# Patient Record
Sex: Male | Born: 1960 | Race: White | Hispanic: No | Marital: Married | Smoking: Never smoker
Health system: Southern US, Community
[De-identification: ages and names within clinical notes are randomized; demographics above are authoritative.]

## PROBLEM LIST (undated history)

## (undated) DIAGNOSIS — I10 Essential (primary) hypertension: Secondary | ICD-10-CM

## (undated) DIAGNOSIS — R140 Abdominal distension (gaseous): Secondary | ICD-10-CM

## (undated) HISTORY — DX: Essential (primary) hypertension: I10

## (undated) HISTORY — DX: Abdominal distension (gaseous): R14.0

---

## 2005-09-11 ENCOUNTER — Observation Stay (HOSPITAL_COMMUNITY): Admission: EM | Admit: 2005-09-11 | Discharge: 2005-09-12 | Payer: Self-pay | Admitting: Emergency Medicine

## 2011-09-04 ENCOUNTER — Ambulatory Visit (INDEPENDENT_AMBULATORY_CARE_PROVIDER_SITE_OTHER): Payer: BC Managed Care – PPO | Admitting: Surgery

## 2011-09-04 ENCOUNTER — Encounter (INDEPENDENT_AMBULATORY_CARE_PROVIDER_SITE_OTHER): Payer: Self-pay | Admitting: Surgery

## 2011-09-04 VITALS — BP 128/82 | HR 72 | Temp 98.1°F | Resp 18 | Ht 71.0 in | Wt 223.4 lb

## 2011-09-04 DIAGNOSIS — K4020 Bilateral inguinal hernia, without obstruction or gangrene, not specified as recurrent: Secondary | ICD-10-CM | POA: Insufficient documentation

## 2011-09-04 DIAGNOSIS — K4090 Unilateral inguinal hernia, without obstruction or gangrene, not specified as recurrent: Secondary | ICD-10-CM

## 2011-09-04 NOTE — Progress Notes (Signed)
Subjective:     Patient ID: David Barnett, male   DOB: [birth year], 50 y.o.   MRN: [id number]  HPI  Patient Care Team: Dibas Koirala as PCP - General (Family Medicine)  This patient is a 50 y.o.male who presents today for surgical evaluation.   Reason for visit: Right groin swelling with probable hernia  Patient is a healthy active male. He noticed right groin pain after some heavy lifting in August. It calmed down. He saw his primary care physician. There was no definite hernia. After an episode of sneezing a few weeks ago, he noted a definite bulge. He has some discomfort. His primary care physician examine and agreed.  The patient has no problems on the left side. No problems with urination. Daily bowel movements. He had a negative cardiac workup in 2006 for atypical chest pain. He is otherwise very active. No prior abdominal nor hernia surgeries  Past Medical History  Diagnosis Date  . Abdominal pain   . Abdominal distention   . Hypertension     History reviewed. No pertinent past surgical history.  History   Social History  . Marital Status: Married    Spouse Name: N/A    Number of Children: N/A  . Years of Education: N/A   Occupational History  . Not on file.   Social History Main Topics  . Smoking status: Never Smoker   . Smokeless tobacco: Never Used  . Alcohol Use: 2.4 oz/week    2 Glasses of wine, 2 Cans of beer per week  . Drug Use: No  . Sexually Active:    Other Topics Concern  . Not on file   Social History Narrative  . No narrative on file    Family History  Problem Relation Age of Onset  . Hearing loss Father 41    heart failure     Current outpatient prescriptions:aspirin 81 MG tablet, Take 81 mg by mouth daily.  , Disp: , Rfl: ;  lisinopril (PRINIVIL,ZESTRIL) 20 MG tablet, , Disp: , Rfl:   No Known Allergies     Review of Systems  Constitutional: Negative for fever, chills and diaphoresis.  HENT: Negative for nosebleeds, sore throat,  facial swelling, mouth sores, trouble swallowing and ear discharge.   Eyes: Negative for photophobia, discharge and visual disturbance.  Respiratory: Negative for choking, chest tightness, shortness of breath and stridor.   Cardiovascular: Negative for chest pain and palpitations.  Gastrointestinal: Negative for nausea, vomiting, abdominal pain, diarrhea, constipation, blood in stool, abdominal distention, anal bleeding and rectal pain.  Genitourinary: Negative for dysuria, urgency, frequency, discharge, penile swelling, difficulty urinating, penile pain and testicular pain.  Musculoskeletal: Negative for myalgias, back pain, arthralgias and gait problem.  Skin: Negative for color change, pallor, rash and wound.  Neurological: Negative for dizziness, speech difficulty, weakness, numbness and headaches.  Hematological: Negative for adenopathy. Does not bruise/bleed easily.  Psychiatric/Behavioral: Negative for hallucinations, confusion and agitation.       Objective:   Physical Exam  Constitutional: He is oriented to person, place, and time. He appears well-developed and well-nourished. No distress.  HENT:  Head: Normocephalic.  Mouth/Throat: Oropharynx is clear and moist. No oropharyngeal exudate.  Eyes: Conjunctivae and EOM are normal. Pupils are equal, round, and reactive to light. No scleral icterus.  Neck: Normal range of motion. Neck supple. No tracheal deviation present.  Cardiovascular: Normal rate, regular rhythm and intact distal pulses.   Pulmonary/Chest: Effort normal and breath sounds normal. No respiratory distress.  Abdominal: Soft.  He exhibits no distension and no mass. There is no tenderness. There is no guarding. Hernia confirmed negative in the left inguinal area.  Genitourinary: Penis normal.     Musculoskeletal: Normal range of motion. He exhibits no tenderness.  Lymphadenopathy:    He has no cervical adenopathy.       Right: No inguinal adenopathy present.        Left: No inguinal adenopathy present.  Neurological: He is alert and oriented to person, place, and time. No cranial nerve deficit. He exhibits normal muscle tone. Coordination normal.  Skin: Skin is warm and dry. No rash noted. He is not diaphoretic. No erythema. No pallor.  Psychiatric: He has a normal mood and affect. His behavior is normal. Judgment and thought content normal.       Assessment:     RIH     Plan:     The anatomy & physiology of the abdominal wall was discussed.  The pathophysiology of hernias was discussed.  Natural history risks without surgery of enlargement, pain, incarceration & strangulation was discussed.   Contributors to complications such as smoking, obesity, diabetes, prior surgery, etc were discussed.  I feel the risks of no intervention will lead to serious problems that outweigh the operative risks; therefore, I recommended surgery to reduce and repair the hernia.  I explained laparoscopic techniques with possible need for an open approach.  I noted the probable use of mesh to patch and/or buttress hernia repair  Risks such as bleeding, infection, abscess, need for further treatment, heart attack, death, and other risks were discussed.  Goals of post-operative recovery were discussed as well.  Possibility that this will not correct all symptoms was explained.  I stressed the importance of low-impact activity, aggressive pain control, avoiding constipation, & not pushing through pain to minimize risk of post-operative chronic pain or injury. Possibility of reherniation was discussed.  We will work to minimize complications.   An educational handout further explaining the pathology & treatment options was given as well.  Questions were answered.  The patient expresses understanding & wishes to proceed with surgery.

## 2011-09-04 NOTE — Patient Instructions (Signed)
Hernia  SEE THE LAPAROSCOPIC HERNIA HANDOUT   A hernia occurs when an internal organ pushes out through a weak spot in the belly (abdominal) wall. Hernias most commonly occur in the groin and around the navel. Hernias also can occur through by cut (incision) made by the surgeon after an abdominal operation. Hernias often can be pushed back into place (reduced). Most hernias tend to get worse over time. Problems occur when abdominal contents get stuck in the opening (incarcerated hernia). The blood supply becomes blocked or impaired (strangulated hernia). Because of these risks, you may require surgery to repair the hernia. CAUSES  Heavy lifting.   Prolonged coughing.   Straining to move your bowels.   Hernias can also occur through a cut (incision) by a surgeon after an abdominal operation.  HOME CARE INSTRUCTIONS  Bed rest is not required. You may continue your normal activities. Avoid heavy lifting (more than 10 pounds) or straining. Cough gently. If you are a smoker it is best to stop. Even the best hernia repair can break down with the continual strain of coughing. Even if you do not have your hernia repaired, a cough will continue to aggravate the problem.   Do not wear anything tight over your hernia. Do not try to keep it in with an outside bandage or truss. These can damage abdominal contents if they are trapped within the hernia sac.   Eat a normal diet. Avoid constipation. Straining over long periods of time will increase hernia size and encourage breakdown of repairs. If you cannot do this with diet alone, stool softeners may be used.  SEEK IMMEDIATE MEDICAL CARE IF: You have problems (symptoms) of a trapped (incarcerated) hernia:  You develop an oral temperature above 102F, or as your caregiver suggests.   You develop increasing abdominal pain.   You feel sick to your stomach (nausea) and vomiting.   The hernia is stuck outside the abdomen, looks discolored, feels hard, or  is tender.   You have any changes in your bowel habits or in the hernia that is unusual for you.   You have increased pain or swelling around the hernia.   You cannot push the hernia back in place by applying gentle pressure while lying down.  MAKE SURE YOU:   Understand these instructions.   Will watch your condition.   Will get help right away if you are not doing well or get worse.  Document Released: 11/18/2005 Document Re-Released: 09/15/2009 [facility] Patient Information 2011 [place], [place].

## 2011-09-17 DIAGNOSIS — K4020 Bilateral inguinal hernia, without obstruction or gangrene, not specified as recurrent: Secondary | ICD-10-CM

## 2011-09-17 HISTORY — PX: HERNIA REPAIR: SHX51

## 2011-09-25 ENCOUNTER — Telehealth (INDEPENDENT_AMBULATORY_CARE_PROVIDER_SITE_OTHER): Payer: Self-pay | Admitting: General Surgery

## 2011-09-25 ENCOUNTER — Telehealth (INDEPENDENT_AMBULATORY_CARE_PROVIDER_SITE_OTHER): Payer: Self-pay

## 2011-09-25 ENCOUNTER — Encounter (INDEPENDENT_AMBULATORY_CARE_PROVIDER_SITE_OTHER): Payer: Self-pay

## 2011-09-25 NOTE — Telephone Encounter (Signed)
Called pt to notify him that Dr Michaell Cowing advised ok to return to work. Pt requested a note to return to work. I will complete note and put at the front desk for pt to p/u 09-26-11./ [facility]

## 2011-09-25 NOTE — Telephone Encounter (Signed)
OK to RTW 29 October Monday

## 2011-09-25 NOTE — Telephone Encounter (Signed)
Mr David Barnett contacted the office requesting that he be able to RTW 09/26/11. His surgery was 09/17/11 for a lap RIH. I wanted to get your authorization for him to RTW

## 2011-10-01 ENCOUNTER — Encounter (INDEPENDENT_AMBULATORY_CARE_PROVIDER_SITE_OTHER): Payer: Self-pay | Admitting: Surgery

## 2011-10-01 ENCOUNTER — Ambulatory Visit (INDEPENDENT_AMBULATORY_CARE_PROVIDER_SITE_OTHER): Payer: BC Managed Care – PPO | Admitting: Surgery

## 2011-10-01 VITALS — BP 128/88 | HR 64 | Temp 97.7°F | Resp 20 | Ht 71.0 in | Wt 216.4 lb

## 2011-10-01 DIAGNOSIS — K458 Other specified abdominal hernia without obstruction or gangrene: Secondary | ICD-10-CM

## 2011-10-01 DIAGNOSIS — K4020 Bilateral inguinal hernia, without obstruction or gangrene, not specified as recurrent: Secondary | ICD-10-CM

## 2011-10-01 DIAGNOSIS — IMO0002 Reserved for concepts with insufficient information to code with codable children: Secondary | ICD-10-CM

## 2011-10-01 NOTE — Patient Instructions (Signed)
Hematoma °A hematoma is a collection of blood which is located any place outside a blood vessel. It is caused by an injury to blood vessels beneath an injured area with a leaking of blood into that area. As blood accumulates it is known as a hematoma. This collection of blood causes a blue to dark blue coloration of the tissue surrounding it. The injury usually improves in days to weeks. If it is close to the skin it often produces a yellowish color of the skin and then disappears completely over a brief period of time. These generally get better without any problems. Hematomas rarely need drainage. °HOME CARE INSTRUCTIONS  °· Apply ice to the injured area for 15 to 20 minutes 3 to 4 times per day for the first 1 or 2 days.  °· Put the ice in a plastic bag and place a towel between the bag of ice and your skin. Discontinue the ice if it causes pain.  °· If you have a hematoma and a nearby injury to the skin, you may have some bleeding. If the bleeding is more than just a little bit, apply pressure to the area for at least thirty minutes.  °· If the injury is on an extremity (arm, leg, hand or foot), elevation of that part may help to decrease pain and swelling. Wrapping with an ace or supportive wrap may also be helpful. If the hematoma is on a lower extremity and is painful, crutches may be helpful for a couple days.  °· If you have been given a tetanus shot because the skin was broken, your arm may get swollen, red and warm to touch at the shot site. This is a normal response to the medicine in the shot. If you did not receive a tetanus shot today because you did not recall when your last one was given, make sure to check with your caregiver's office and determine if one is needed. Generally for a "dirty" wound, you should receive a tetanus booster if you have not had one in the last five years. If you have a "clean" wound, you should receive a tetanus booster if you have not had one within the last ten years.   °· Only take over-the-counter or prescription medicines for pain, discomfort, or fever as directed by your caregiver. Do not use aspirin as it may cause bleeding.  °SEEK IMMEDIATE MEDICAL CARE IF:  °· You have pain not controlled with medication.  °· You develop increasing pain or swelling in the area of injury.  °· An oral temperature above 102° F (38.9° C) develops.  °· You develop any problems that seem worse than the problems that brought you in.  °MAKE SURE YOU:  °· Understand these instructions.  °· Will watch your condition.  °· Will get help right away if you are not doing well or get worse.  °Document Released: 07/02/2004 Document Revised: 06/13/2011 Document Reviewed: 07/19/2008 °[employer]® Patient Information ©2012 [employer]. °

## 2011-10-01 NOTE — Progress Notes (Signed)
Subjective:     Patient ID: David Barnett, male   DOB: [birth year], 50 y.o.   MRN: [id number]  HPI  Patient Care Team: Dibas Koirala as PCP - General (Family Medicine)  This patient is a 50 y.o.male who presents today for surgical evaluation.   Procedure: Laparoscopic bilateral inguinal and right obturator hernia repairs 09/17/2011  The patient comes in today feeling better. Had some moderate bruising in his scrotum. That is nearly resolved. Mild groin soreness nearly resolved. Eating well. Urinating fine. Regular problems. He is already back to work  Past Medical History  Diagnosis Date  . Abdominal pain   . Abdominal distention   . Hypertension     Past Surgical History  Procedure Date  . Hernia repair 09/17/11    Lap RIH repair with mesh     History   Social History  . Marital Status: Married    Spouse Name: N/A    Number of Children: N/A  . Years of Education: N/A   Occupational History  . Not on file.   Social History Main Topics  . Smoking status: Never Smoker   . Smokeless tobacco: Never Used  . Alcohol Use: 2.4 oz/week    2 Glasses of wine, 2 Cans of beer per week  . Drug Use: No  . Sexually Active:    Other Topics Concern  . Not on file   Social History Narrative  . No narrative on file    Family History  Problem Relation Age of Onset  . Hearing loss Father 30    heart failure     Current outpatient prescriptions:aspirin 81 MG tablet, Take 81 mg by mouth daily.  , Disp: , Rfl: ;  lisinopril (PRINIVIL,ZESTRIL) 20 MG tablet, , Disp: , Rfl:   No Known Allergies    Review of Systems  Constitutional: Negative for fever, chills and diaphoresis.  HENT: Negative for sore throat, trouble swallowing and neck pain.   Eyes: Negative for photophobia and visual disturbance.  Respiratory: Negative for choking and shortness of breath.   Cardiovascular: Negative for chest pain and palpitations.  Gastrointestinal: Negative for nausea, vomiting, abdominal  distention, anal bleeding and rectal pain.  Genitourinary: Negative for dysuria, urgency, penile swelling, scrotal swelling, difficulty urinating, penile pain and testicular pain.       Scrotal bruising, nearly gone  Musculoskeletal: Negative for myalgias, arthralgias and gait problem.  Skin: Negative for color change and rash.  Neurological: Negative for dizziness, speech difficulty, weakness and numbness.  Hematological: Negative for adenopathy.  Psychiatric/Behavioral: Negative for hallucinations, confusion and agitation.       Objective:   Physical Exam  Constitutional: He is oriented to person, place, and time. He appears well-developed and well-nourished. No distress.  HENT:  Head: Normocephalic.  Mouth/Throat: Oropharynx is clear and moist. No oropharyngeal exudate.  Eyes: Conjunctivae and EOM are normal. Pupils are equal, round, and reactive to light. No scleral icterus.  Neck: Normal range of motion. No tracheal deviation present.  Cardiovascular: Normal rate, normal heart sounds and intact distal pulses.   Pulmonary/Chest: Effort normal. No respiratory distress.  Abdominal: Soft. He exhibits no distension. There is no tenderness. Hernia confirmed negative in the right inguinal area and confirmed negative in the left inguinal area.       Incisions clean with normal healing ridges.  No hernias  Genitourinary: Penis normal. No penile tenderness.       Right groin 3x4cm seroma.  Mildl TTP  Musculoskeletal: Normal range of motion. He  exhibits no tenderness.  Neurological: He is alert and oriented to person, place, and time. No cranial nerve deficit. He exhibits normal muscle tone. Coordination normal.  Skin: Skin is warm and dry. No rash noted. He is not diaphoretic.  Psychiatric: He has a normal mood and affect. His behavior is normal.       Assessment:    2 weeks s/p lap BIH and R obturator hernia repairs, recovering well    Plan:     Increase activity as tolerated.  Do  not push through pain.  Seroma should resolve over 1-2 months.  Advanced on diet as tolerated. Bowel regimen to avoid problems.  Return to clinic p.r.n. The patient expressed understanding and appreciation

## 2015-03-13 ENCOUNTER — Other Ambulatory Visit: Payer: Self-pay | Admitting: Family Medicine

## 2015-03-13 DIAGNOSIS — R109 Unspecified abdominal pain: Secondary | ICD-10-CM

## 2015-03-13 DIAGNOSIS — R945 Abnormal results of liver function studies: Secondary | ICD-10-CM

## 2015-03-13 DIAGNOSIS — R7989 Other specified abnormal findings of blood chemistry: Secondary | ICD-10-CM

## 2015-03-14 ENCOUNTER — Ambulatory Visit
Admission: RE | Admit: 2015-03-14 | Discharge: 2015-03-14 | Disposition: A | Discharge status: Home / Self Care | Payer: No Typology Code available for payment source | Source: Ambulatory Visit | Attending: Family Medicine | Admitting: Family Medicine

## 2015-03-14 DIAGNOSIS — R945 Abnormal results of liver function studies: Secondary | ICD-10-CM

## 2015-03-14 DIAGNOSIS — R109 Unspecified abdominal pain: Secondary | ICD-10-CM

## 2015-03-14 DIAGNOSIS — R7989 Other specified abnormal findings of blood chemistry: Secondary | ICD-10-CM

## 2015-03-15 ENCOUNTER — Other Ambulatory Visit: Payer: Self-pay

## 2015-03-16 ENCOUNTER — Other Ambulatory Visit: Payer: Self-pay | Admitting: Gastroenterology

## 2015-03-16 DIAGNOSIS — R740 Nonspecific elevation of levels of transaminase and lactic acid dehydrogenase [LDH]: Secondary | ICD-10-CM

## 2015-03-16 DIAGNOSIS — R7401 Elevation of levels of liver transaminase levels: Secondary | ICD-10-CM

## 2015-03-16 DIAGNOSIS — K8020 Calculus of gallbladder without cholecystitis without obstruction: Secondary | ICD-10-CM

## 2015-03-26 ENCOUNTER — Other Ambulatory Visit: Payer: No Typology Code available for payment source

## 2015-04-05 ENCOUNTER — Other Ambulatory Visit: Payer: Self-pay | Admitting: Surgery

## 2016-05-21 DIAGNOSIS — Z23 Encounter for immunization: Secondary | ICD-10-CM | POA: Diagnosis not present

## 2016-05-21 DIAGNOSIS — I10 Essential (primary) hypertension: Secondary | ICD-10-CM | POA: Diagnosis not present

## 2016-05-21 DIAGNOSIS — Z0000 Encounter for general adult medical examination without abnormal findings: Secondary | ICD-10-CM | POA: Diagnosis not present

## 2016-05-21 DIAGNOSIS — Z13220 Encounter for screening for lipoid disorders: Secondary | ICD-10-CM | POA: Diagnosis not present

## 2016-05-21 DIAGNOSIS — Z125 Encounter for screening for malignant neoplasm of prostate: Secondary | ICD-10-CM | POA: Diagnosis not present

## 2016-05-21 DIAGNOSIS — S86911A Strain of unspecified muscle(s) and tendon(s) at lower leg level, right leg, initial encounter: Secondary | ICD-10-CM | POA: Diagnosis not present

## 2016-10-09 DIAGNOSIS — Z23 Encounter for immunization: Secondary | ICD-10-CM | POA: Diagnosis not present

## 2017-02-03 DIAGNOSIS — H0289 Other specified disorders of eyelid: Secondary | ICD-10-CM | POA: Diagnosis not present

## 2017-02-03 DIAGNOSIS — H2513 Age-related nuclear cataract, bilateral: Secondary | ICD-10-CM | POA: Diagnosis not present

## 2017-02-03 DIAGNOSIS — H35033 Hypertensive retinopathy, bilateral: Secondary | ICD-10-CM | POA: Diagnosis not present

## 2017-02-03 DIAGNOSIS — H25013 Cortical age-related cataract, bilateral: Secondary | ICD-10-CM | POA: Diagnosis not present

## 2017-05-23 DIAGNOSIS — L989 Disorder of the skin and subcutaneous tissue, unspecified: Secondary | ICD-10-CM | POA: Diagnosis not present

## 2017-05-23 DIAGNOSIS — I10 Essential (primary) hypertension: Secondary | ICD-10-CM | POA: Diagnosis not present

## 2017-05-23 DIAGNOSIS — Z0000 Encounter for general adult medical examination without abnormal findings: Secondary | ICD-10-CM | POA: Diagnosis not present

## 2017-05-23 DIAGNOSIS — N401 Enlarged prostate with lower urinary tract symptoms: Secondary | ICD-10-CM | POA: Diagnosis not present

## 2017-07-02 DIAGNOSIS — I10 Essential (primary) hypertension: Secondary | ICD-10-CM | POA: Diagnosis not present

## 2017-09-02 DIAGNOSIS — D485 Neoplasm of uncertain behavior of skin: Secondary | ICD-10-CM | POA: Diagnosis not present

## 2017-09-02 DIAGNOSIS — D1801 Hemangioma of skin and subcutaneous tissue: Secondary | ICD-10-CM | POA: Diagnosis not present

## 2017-09-02 DIAGNOSIS — D2262 Melanocytic nevi of left upper limb, including shoulder: Secondary | ICD-10-CM | POA: Diagnosis not present

## 2017-09-02 DIAGNOSIS — L821 Other seborrheic keratosis: Secondary | ICD-10-CM | POA: Diagnosis not present

## 2017-09-30 DIAGNOSIS — Z23 Encounter for immunization: Secondary | ICD-10-CM | POA: Diagnosis not present

## 2017-11-05 DIAGNOSIS — L905 Scar conditions and fibrosis of skin: Secondary | ICD-10-CM | POA: Diagnosis not present

## 2017-11-05 DIAGNOSIS — D485 Neoplasm of uncertain behavior of skin: Secondary | ICD-10-CM | POA: Diagnosis not present

## 2018-06-10 DIAGNOSIS — Z0184 Encounter for antibody response examination: Secondary | ICD-10-CM | POA: Diagnosis not present

## 2018-06-10 DIAGNOSIS — Z0000 Encounter for general adult medical examination without abnormal findings: Secondary | ICD-10-CM | POA: Diagnosis not present

## 2018-12-11 ENCOUNTER — Ambulatory Visit (INDEPENDENT_AMBULATORY_CARE_PROVIDER_SITE_OTHER): Payer: BLUE CROSS/BLUE SHIELD

## 2018-12-11 ENCOUNTER — Ambulatory Visit: Payer: BLUE CROSS/BLUE SHIELD | Admitting: Podiatry

## 2018-12-11 ENCOUNTER — Encounter: Payer: Self-pay | Admitting: Podiatry

## 2018-12-11 VITALS — BP 159/93 | HR 76

## 2018-12-11 DIAGNOSIS — M778 Other enthesopathies, not elsewhere classified: Secondary | ICD-10-CM

## 2018-12-11 DIAGNOSIS — M779 Enthesopathy, unspecified: Secondary | ICD-10-CM | POA: Diagnosis not present

## 2018-12-11 NOTE — Progress Notes (Signed)
  Subjective:  Patient ID: David Barnett, male    DOB: 1961-07-06,  MRN: [id number]  Chief Complaint  Patient presents with  . Foot Pain    (NP) ball of left foot is painful. Pt states dull pronounced pain below his 2nd and 3rd toes on plantar forefoot. Pt states he wore old work shoes 6 wks ago and has had pain since then, pain is typically only when walking.   . Foot Problem    Pt states interdigital space between left 2nd and 3rd toes has increased. States "wife noticed it around the same time as the pain in the bottom of my left foot"    58 y.o. male presents with the above complaint.   Review of Systems: Negative except as noted in the HPI. Denies N/V/F/Ch.  Past Medical History:  Diagnosis Date  . Abdominal distention   . Abdominal pain   . Hypertension     Current Outpatient Medications:  .  aspirin 81 MG tablet, Take 81 mg by mouth daily.  , Disp: , Rfl:  .  lisinopril (PRINIVIL,ZESTRIL) 20 MG tablet, , Disp: , Rfl:   Social History   Tobacco Use  Smoking Status Never Smoker  Smokeless Tobacco Never Used    No Known Allergies Objective:   Vitals:   12/11/18 0835  BP: (!) 159/93  Pulse: 76   There is no height or weight on file to calculate BMI. Constitutional Well developed. Well nourished.  Vascular Dorsalis pedis pulses palpable bilaterally. Posterior tibial pulses palpable bilaterally. Capillary refill normal to all digits.  No cyanosis or clubbing noted. Pedal hair growth normal.  Neurologic Normal speech. Oriented to person, place, and time. Epicritic sensation to light touch grossly present bilaterally.  Dermatologic Nails well groomed and normal in appearance. No open wounds. No skin lesions.  Orthopedic: POP L 2nd interspace with Sullivan's sign and Mulder's clickl   Radiographs: Taken and reviewed. Lendell Caprice sign. Decreased 2nd IMA Assessment:   1. Capsulitis of left foot    Plan:  Patient was evaluated and treated and all questions  answered.  Interdigital Neuroma, left -Educated on etiology -Interspace injection delivered as below. -Educated on padding and proper shoegear  Procedure: Neuroma Injection Location: Left 2nd interspace Skin Prep: Alcohol. Injectate: 0.5 cc 0.5% marcaine plain, 0.5 cc dexamethasone phosphate. Disposition: Patient tolerated procedure well. Injection site dressed with a band-aid.  Return in about 3 weeks (around 01/01/2019) for Neuroma L foot injection f/u.

## 2019-01-01 ENCOUNTER — Ambulatory Visit: Payer: BLUE CROSS/BLUE SHIELD | Admitting: Podiatry

## 2019-01-01 DIAGNOSIS — G5762 Lesion of plantar nerve, left lower limb: Secondary | ICD-10-CM | POA: Diagnosis not present

## 2019-01-01 NOTE — Progress Notes (Signed)
  Subjective:  Patient ID: David Barnett, male    DOB: [birth year],  MRN: [id number]  Chief Complaint  Patient presents with  . Foot Problem    neuroma of left foot. Pain is still present but is better from how it was last time   58 y.o. male presents with the above complaint.  States that the injection helped.  Review of Systems: Negative except as noted in the HPI. Denies N/V/F/Ch.  Past Medical History:  Diagnosis Date  . Abdominal distention   . Abdominal pain   . Hypertension     Current Outpatient Medications:  .  aspirin 81 MG tablet, Take 81 mg by mouth daily.  , Disp: , Rfl:  .  lisinopril (PRINIVIL,ZESTRIL) 20 MG tablet, , Disp: , Rfl:  .  tamsulosin (FLOMAX) 0.4 MG CAPS capsule, Take 0.4 mg by mouth daily., Disp: , Rfl:   Social History   Tobacco Use  Smoking Status Never Smoker  Smokeless Tobacco Never Used    No Known Allergies Objective:   There were no vitals filed for this visit. There is no height or weight on file to calculate BMI. Constitutional Well developed. Well nourished.  Vascular Dorsalis pedis pulses palpable bilaterally. Posterior tibial pulses palpable bilaterally. Capillary refill normal to all digits.  No cyanosis or clubbing noted. Pedal hair growth normal.  Neurologic Normal speech. Oriented to person, place, and time. Epicritic sensation to light touch grossly present bilaterally.  Dermatologic Nails well groomed and normal in appearance. No open wounds. No skin lesions.  Orthopedic: POP L 2nd interspace with Sullivan's sign and Mulder's click   Radiographs: None Assessment:   1. Morton neuroma, left    Plan:  Patient was evaluated and treated and all questions answered.  Interdigital Neuroma, left -Interspace injection #2 delivered as below.  Procedure: Neuroma Injection Location: Left 2nd interspace Skin Prep: Alcohol. Injectate: 0.5 cc 0.5% marcaine plain, 0.5 cc dexamethasone phosphate. Disposition: Patient  tolerated procedure well. Injection site dressed with a band-aid.   Return in about 3 weeks (around 01/22/2019) for Neuroma, Left.

## 2019-01-21 ENCOUNTER — Ambulatory Visit: Payer: BLUE CROSS/BLUE SHIELD | Admitting: Podiatry

## 2019-01-21 DIAGNOSIS — M7751 Other enthesopathy of right foot: Secondary | ICD-10-CM | POA: Diagnosis not present

## 2019-01-21 DIAGNOSIS — M7752 Other enthesopathy of left foot: Secondary | ICD-10-CM | POA: Diagnosis not present

## 2019-01-21 DIAGNOSIS — G5762 Lesion of plantar nerve, left lower limb: Secondary | ICD-10-CM

## 2019-01-30 NOTE — Progress Notes (Signed)
  Subjective:  Patient ID: David Barnett, male    DOB: 1960/12/23,  MRN: [id number]  Chief Complaint  Patient presents with  . Neuroma    Pt states much improvement from injections but still has some pain interdigital 2nd and 3rd on left foot plantar.   58 y.o. male presents with the above complaint.  Review of Systems: Negative except as noted in the HPI. Denies N/V/F/Ch.  Past Medical History:  Diagnosis Date  . Abdominal distention   . Abdominal pain   . Hypertension     Current Outpatient Medications:  .  aspirin 81 MG tablet, Take 81 mg by mouth daily.  , Disp: , Rfl:  .  lisinopril (PRINIVIL,ZESTRIL) 20 MG tablet, , Disp: , Rfl:  .  tamsulosin (FLOMAX) 0.4 MG CAPS capsule, Take 0.4 mg by mouth daily., Disp: , Rfl:   Social History   Tobacco Use  Smoking Status Never Smoker  Smokeless Tobacco Never Used    No Known Allergies Objective:   There were no vitals filed for this visit. There is no height or weight on file to calculate BMI. Constitutional Well developed. Well nourished.  Vascular Dorsalis pedis pulses palpable bilaterally. Posterior tibial pulses palpable bilaterally. Capillary refill normal to all digits.  No cyanosis or clubbing noted. Pedal hair growth normal.  Neurologic Normal speech. Oriented to person, place, and time. Epicritic sensation to light touch grossly present bilaterally.  Dermatologic Nails well groomed and normal in appearance. No open wounds. No skin lesions.  Orthopedic: POP L 2nd interspace with Sullivan's sign and Mulder's click   Radiographs: None Assessment:   No diagnosis found. Plan:  Patient was evaluated and treated and all questions answered.  Interdigital Neuroma, left -Interspace injection #3 delivered as below.  Procedure: Neuroma Injection Location: Left 2nd interspace Skin Prep: Alcohol. Injectate: 0.5 cc 0.5% marcaine plain, 0.5 cc dexamethasone phosphate. Disposition: Patient tolerated procedure  well. Injection site dressed with a band-aid.    No follow-ups on file.

## 2019-02-23 ENCOUNTER — Encounter: Payer: BLUE CROSS/BLUE SHIELD | Admitting: Orthotics

## 2019-06-24 DIAGNOSIS — Z0000 Encounter for general adult medical examination without abnormal findings: Secondary | ICD-10-CM | POA: Diagnosis not present

## 2019-06-29 DIAGNOSIS — I10 Essential (primary) hypertension: Secondary | ICD-10-CM | POA: Diagnosis not present

## 2019-06-29 DIAGNOSIS — Z23 Encounter for immunization: Secondary | ICD-10-CM | POA: Diagnosis not present

## 2019-06-29 DIAGNOSIS — N401 Enlarged prostate with lower urinary tract symptoms: Secondary | ICD-10-CM | POA: Diagnosis not present

## 2019-06-29 DIAGNOSIS — Z8249 Family history of ischemic heart disease and other diseases of the circulatory system: Secondary | ICD-10-CM | POA: Diagnosis not present

## 2019-08-16 DIAGNOSIS — M25511 Pain in right shoulder: Secondary | ICD-10-CM | POA: Diagnosis not present

## 2019-08-19 ENCOUNTER — Ambulatory Visit: Payer: BC Managed Care – PPO | Admitting: Pediatrics

## 2019-08-19 ENCOUNTER — Ambulatory Visit
Admission: RE | Admit: 2019-08-19 | Discharge: 2019-08-19 | Disposition: A | Discharge status: Home / Self Care | Payer: BC Managed Care – PPO | Source: Ambulatory Visit | Attending: Sports Medicine | Admitting: Sports Medicine

## 2019-08-19 ENCOUNTER — Other Ambulatory Visit: Payer: Self-pay

## 2019-08-19 VITALS — BP 150/90 | Ht 71.0 in | Wt 193.0 lb

## 2019-08-19 DIAGNOSIS — M25511 Pain in right shoulder: Secondary | ICD-10-CM

## 2019-08-19 NOTE — Progress Notes (Signed)
  David Barnett - 58 y.o. male MRN [id number]  Date of birth: 05-09-1961  SUBJECTIVE:   CC: right shoulder pain   58 yo gentlemen presenting with right shoulder pain for 2 weeks after falling on shoulder. He reports that he was walking down a slippery hill and his feet came out from under him and he landed directly on right shoulder. Since the fall, he is unable to lift shoulder or reach forward. He has a constant, dull ache over. Cannot sleep on right side. He has been mainly keeping his arm held at his side and minimizing use. He took ibuprofen initially but not recently. He has noticed bruising along the front of his arm. No numbness/tingling   ROS: No swelling, instability, muscle pain, numbness/tingling, redness, otherwise see HPI   PMHx - Updated and reviewed.  Contributory factors include: Negative PSHx - Updated and reviewed.  Contributory factors include:  Negative FHx - Updated and reviewed.  Contributory factors include:  Negative Social Hx - Updated and reviewed. Contributory factors include: Negative Medications - reviewed   DATA REVIEWED: None  PHYSICAL EXAM:  VS: BP:(!) 150/90  HR: bpm  TEMP: ( )  RESP:   HT:5\' 11"  (180.3 cm)   WT:193 lb (87.5 kg)  BMI:26.93 PHYSICAL EXAM: Gen: NAD, alert, cooperative with exam, well-appearing HEENT: clear conjunctiva,  CV:  no edema, capillary refill brisk, normal rate Resp: non-labored Skin: no rashes, normal turgor  Neuro: no gross deficits.  Psych:  alert and oriented  Right Shoulder: Inspection reveals right shoulder elevated compared to left. No notable swelling or stepoff. Bruising noted along anterior arm.  Palpation is normal with no TTP over [facility] joint or bicipital groove. Severely limited ROM- unable to actively or passivly flex or abduct arm above 45 degrees, full internal rotation but limited ER.  NV intact distally Special Tests:  - Supraspinatous:  4/5 strength with resisted flexion at 20 degrees (cannot flex arm  to 90-- tested at 45 degrees) - Infraspinatous/Teres Minor: 5/5 strength with ER - Subscapularis: 5/5 strength with IR - Biceps tendon: Negative Speeds, Yerrgason's    Left Shoulder: Inspection reveals no obvious deformity, atrophy, or asymmetry. No bruising. No swelling Palpation is normal with no TTP over [facility] joint or bicipital groove. Full ROM in flexion, abduction, internal/external rotation NV intact distally Normal scapular function observed. Special Tests:  - Supraspinatous: 5/5 strength with resisted flexion at 20 degrees - Infraspinatous/Teres Minor: 5/5 strength with ER - Subscapularis: 5/5 strength with IR - No painful arc and no drop arm sign   ASSESSMENT & PLAN:   58 yo man presenting with acute right shoulder pain and limited mobility after traumatic fall. Exam with bruising over arm and severely limited range of motion concerning for proximal humeral fracture. Will obtain two view x-ray of shoulder with open field of view to include proximal humerus. Will call him with results. Scheduled follow up for next week to obtain complete shoulder ultrasound to evaluate for rotator cuff pathology in case x-rays are negative. Provided sling for comfort. Gave him pendulum exercises but instructed him to keep arm in sling until I call him with results in case of fracture.   I was the preceptor for this visit and available for immediate consultation. Shellia Cleverly, DO

## 2019-08-26 ENCOUNTER — Ambulatory Visit: Payer: Self-pay

## 2019-08-26 ENCOUNTER — Other Ambulatory Visit: Payer: Self-pay

## 2019-08-26 ENCOUNTER — Ambulatory Visit (INDEPENDENT_AMBULATORY_CARE_PROVIDER_SITE_OTHER): Payer: BC Managed Care – PPO | Admitting: Pediatrics

## 2019-08-26 VITALS — BP 130/76 | Ht 71.0 in | Wt 193.0 lb

## 2019-08-26 DIAGNOSIS — M25511 Pain in right shoulder: Secondary | ICD-10-CM

## 2019-08-26 NOTE — Patient Instructions (Addendum)
Your MRI is scheduled for Saturday 08/28/2019 @ 8 am. You will need to arrive at 7:30 am It's at the [facility]. [place],  [id number] [phone]  We have scheduled you to see Dr. Tamera Punt for your right shoulder [place] [phone]  Appt: Saturday 08/28/2019, please arrive at 11:45 am. Bring your insurance card, photo ID, and wear a mask to your appt.

## 2019-08-26 NOTE — Progress Notes (Signed)
David Barnett presents for ultrasound of right shoulder. Was seen last week after acute injury, x-rays with no fracture. Please see full history and exam from previous note 08/19/2019.  ULTRASOUND: Shoulder, right Diagnostic complete ultrasound imaging obtained of patient's right shoulder.  - No obvious evidence of bony deformity or osteophyte development appreciated.  - Long head of the biceps tendon: No with evidence of tendon thickening, calcification, subluxation, or tearing in short or long axis views. Bullseye sign present - Subscapularis tendon: complete visualization across the width of the insertion point yielded no evidence of tendon thickening, calcification, or tears in the long axis view.  - Supraspinatus tendon: complete visualization across the width of the insertion point yielded full thickness tear in anterior portion in the long axis view.   - Infraspinatus and teres minor tendons: visualization across the width of the insertion points yielded no evidence of tendon thickening, calcification, or tears in the long axis view.  [facility] Joint: No evidence of joint separation, collapse, or osteophyte development appreciated. Effusion present.   IMPRESSION: findings consistent with full thickness supraspinatus tear.   Plan: - MRI of right shoulder scheduled for Saturday morning - referral to Dr. Tamera Punt for likely surgery   I was the preceptor for this visit and available for immediate consultation.  Ultrasound shows findings consistent with full-thickness retracted supraspinatus tear.  MRI for probable presurgical planning scheduled for Saturday morning at 8:00.  Patient will then follow-up with Dr. Tamera Punt at 11:45.  Definitive treatment per Dr. Bettina Gavia discretion.

## 2019-08-26 NOTE — Addendum Note (Signed)
Addended by: Jolinda Croak E on: 08/26/2019 01:46 PM   Modules accepted: Orders

## 2019-08-28 ENCOUNTER — Ambulatory Visit (HOSPITAL_BASED_OUTPATIENT_CLINIC_OR_DEPARTMENT_OTHER): Payer: BC Managed Care – PPO

## 2019-09-02 DIAGNOSIS — Z79899 Other long term (current) drug therapy: Secondary | ICD-10-CM | POA: Diagnosis not present

## 2019-09-02 DIAGNOSIS — Z23 Encounter for immunization: Secondary | ICD-10-CM | POA: Diagnosis not present

## 2019-09-03 ENCOUNTER — Ambulatory Visit (HOSPITAL_COMMUNITY)
Admission: RE | Admit: 2019-09-03 | Discharge: 2019-09-03 | Disposition: A | Discharge status: Home / Self Care | Payer: BC Managed Care – PPO | Source: Ambulatory Visit | Attending: Sports Medicine | Admitting: Sports Medicine

## 2019-09-03 ENCOUNTER — Other Ambulatory Visit: Payer: Self-pay

## 2019-09-03 DIAGNOSIS — S46011A Strain of muscle(s) and tendon(s) of the rotator cuff of right shoulder, initial encounter: Secondary | ICD-10-CM | POA: Diagnosis not present

## 2019-09-03 DIAGNOSIS — M25511 Pain in right shoulder: Secondary | ICD-10-CM | POA: Diagnosis not present

## 2019-09-03 DIAGNOSIS — M75111 Incomplete rotator cuff tear or rupture of right shoulder, not specified as traumatic: Secondary | ICD-10-CM | POA: Diagnosis not present

## 2019-09-06 DIAGNOSIS — M75121 Complete rotator cuff tear or rupture of right shoulder, not specified as traumatic: Secondary | ICD-10-CM | POA: Diagnosis not present

## 2019-09-07 ENCOUNTER — Telehealth: Payer: Self-pay | Admitting: Sports Medicine

## 2019-09-07 NOTE — Telephone Encounter (Signed)
  Patient notified via telephone of MRI findings of the right shoulder.  MRI shows severe tendinosis of the supraspinatus tendon with a high-grade partial thickness bursal surface tear with 5 mm of full-thickness component anteriorly.  Patient has already met with Dr. Tamera Punt and has surgery planned sometime within the next 2 to 3 weeks.

## 2019-09-11 DIAGNOSIS — Z23 Encounter for immunization: Secondary | ICD-10-CM | POA: Diagnosis not present

## 2019-09-21 DIAGNOSIS — Y999 Unspecified external cause status: Secondary | ICD-10-CM | POA: Diagnosis not present

## 2019-09-21 DIAGNOSIS — M7521 Bicipital tendinitis, right shoulder: Secondary | ICD-10-CM | POA: Diagnosis not present

## 2019-09-21 DIAGNOSIS — X58XXXA Exposure to other specified factors, initial encounter: Secondary | ICD-10-CM | POA: Diagnosis not present

## 2019-09-21 DIAGNOSIS — G8918 Other acute postprocedural pain: Secondary | ICD-10-CM | POA: Diagnosis not present

## 2019-09-21 DIAGNOSIS — M7541 Impingement syndrome of right shoulder: Secondary | ICD-10-CM | POA: Diagnosis not present

## 2019-09-21 DIAGNOSIS — S46011A Strain of muscle(s) and tendon(s) of the rotator cuff of right shoulder, initial encounter: Secondary | ICD-10-CM | POA: Diagnosis not present

## 2019-10-04 DIAGNOSIS — M25611 Stiffness of right shoulder, not elsewhere classified: Secondary | ICD-10-CM | POA: Diagnosis not present

## 2019-10-04 DIAGNOSIS — M75101 Unspecified rotator cuff tear or rupture of right shoulder, not specified as traumatic: Secondary | ICD-10-CM | POA: Diagnosis not present

## 2019-10-18 DIAGNOSIS — M25611 Stiffness of right shoulder, not elsewhere classified: Secondary | ICD-10-CM | POA: Diagnosis not present

## 2019-10-18 DIAGNOSIS — M75101 Unspecified rotator cuff tear or rupture of right shoulder, not specified as traumatic: Secondary | ICD-10-CM | POA: Diagnosis not present

## 2019-11-02 DIAGNOSIS — M75101 Unspecified rotator cuff tear or rupture of right shoulder, not specified as traumatic: Secondary | ICD-10-CM | POA: Diagnosis not present

## 2019-11-02 DIAGNOSIS — M25611 Stiffness of right shoulder, not elsewhere classified: Secondary | ICD-10-CM | POA: Diagnosis not present

## 2019-11-09 DIAGNOSIS — M75101 Unspecified rotator cuff tear or rupture of right shoulder, not specified as traumatic: Secondary | ICD-10-CM | POA: Diagnosis not present

## 2019-11-09 DIAGNOSIS — M25611 Stiffness of right shoulder, not elsewhere classified: Secondary | ICD-10-CM | POA: Diagnosis not present

## 2019-11-12 ENCOUNTER — Ambulatory Visit: Payer: BC Managed Care – PPO | Admitting: Podiatry

## 2019-11-12 ENCOUNTER — Other Ambulatory Visit: Payer: Self-pay

## 2019-11-12 DIAGNOSIS — M778 Other enthesopathies, not elsewhere classified: Secondary | ICD-10-CM | POA: Diagnosis not present

## 2019-11-12 DIAGNOSIS — G5762 Lesion of plantar nerve, left lower limb: Secondary | ICD-10-CM

## 2019-11-16 DIAGNOSIS — M25611 Stiffness of right shoulder, not elsewhere classified: Secondary | ICD-10-CM | POA: Diagnosis not present

## 2019-11-16 DIAGNOSIS — M75101 Unspecified rotator cuff tear or rupture of right shoulder, not specified as traumatic: Secondary | ICD-10-CM | POA: Diagnosis not present

## 2019-11-30 DIAGNOSIS — M25611 Stiffness of right shoulder, not elsewhere classified: Secondary | ICD-10-CM | POA: Diagnosis not present

## 2019-11-30 DIAGNOSIS — M75101 Unspecified rotator cuff tear or rupture of right shoulder, not specified as traumatic: Secondary | ICD-10-CM | POA: Diagnosis not present

## 2019-12-05 NOTE — Progress Notes (Signed)
  Subjective:  Patient ID: David Barnett, male    DOB: [birth year],  MRN: [id number]  Chief Complaint  Patient presents with  . Foot Pain    Pt states sub 1st plantar lesion, painful, 1 month duration, denies any drainage, no known injury. Pt states he believes his orthotics may be the cause.   59 y.o. male presents with the above complaint.  Review of Systems: Negative except as noted in the HPI. Denies N/V/F/Ch.  Past Medical History:  Diagnosis Date  . Abdominal distention   . Abdominal pain   . Hypertension     Current Outpatient Medications:  .  aspirin 81 MG tablet, Take 81 mg by mouth daily.  , Disp: , Rfl:  .  lisinopril (PRINIVIL,ZESTRIL) 20 MG tablet, , Disp: , Rfl:  .  lisinopril (ZESTRIL) 40 MG tablet, Take 40 mg by mouth daily., Disp: , Rfl:  .  oxyCODONE-acetaminophen (PERCOCET/ROXICET) 5-325 MG tablet, Take 1-2 tablets by mouth every 4 (four) hours as needed., Disp: , Rfl:  .  tamsulosin (FLOMAX) 0.4 MG CAPS capsule, Take 0.4 mg by mouth daily., Disp: , Rfl:  .  tiZANidine (ZANAFLEX) 2 MG tablet, Take 2 mg by mouth every 8 (eight) hours as needed., Disp: , Rfl:   Social History   Tobacco Use  Smoking Status Never Smoker  Smokeless Tobacco Never Used    No Known Allergies Objective:   There were no vitals filed for this visit. There is no height or weight on file to calculate BMI. Constitutional Well developed. Well nourished.  Vascular Dorsalis pedis pulses palpable bilaterally. Posterior tibial pulses palpable bilaterally. Capillary refill normal to all digits.  No cyanosis or clubbing noted. Pedal hair growth normal.  Neurologic Normal speech. Oriented to person, place, and time. Epicritic sensation to light touch grossly present bilaterally.  Dermatologic Nails well groomed and normal in appearance. No open wounds. No skin lesions.  Orthopedic: POP L 2nd interspace with Sullivan's sign and Mulder's click   Radiographs: None Assessment:   1.  Morton neuroma, left   2. Capsulitis of left foot    Plan:  Patient was evaluated and treated and all questions answered.  Interdigital Neuroma, left -No injection today -Will refurbish orthotics.   No follow-ups on file.

## 2019-12-07 ENCOUNTER — Telehealth: Payer: Self-pay | Admitting: Podiatry

## 2019-12-07 DIAGNOSIS — M25611 Stiffness of right shoulder, not elsewhere classified: Secondary | ICD-10-CM | POA: Diagnosis not present

## 2019-12-07 DIAGNOSIS — M75101 Unspecified rotator cuff tear or rupture of right shoulder, not specified as traumatic: Secondary | ICD-10-CM | POA: Diagnosis not present

## 2019-12-07 NOTE — Telephone Encounter (Signed)
Pt left message checking on status of adjusted orthotics... Lvm for pt that they are in and I have it noted to pick up at appt on 1.8.2021 with Dr Samuella Cota but if he wanted to pick up sooner he is more than welcome to stop by the office and get them.Marland Kitchen

## 2019-12-10 ENCOUNTER — Encounter: Payer: Self-pay | Admitting: Podiatry

## 2019-12-10 ENCOUNTER — Ambulatory Visit: Payer: BC Managed Care – PPO | Admitting: Podiatry

## 2019-12-10 ENCOUNTER — Other Ambulatory Visit: Payer: Self-pay

## 2019-12-10 DIAGNOSIS — M778 Other enthesopathies, not elsewhere classified: Secondary | ICD-10-CM

## 2019-12-10 DIAGNOSIS — G5762 Lesion of plantar nerve, left lower limb: Secondary | ICD-10-CM

## 2019-12-10 NOTE — Progress Notes (Signed)
  Subjective:  Patient ID: Clanton Emanuelson, male    DOB: 01-May-1961,  MRN: [id number]  Chief Complaint  Patient presents with  . Neuroma    L foot follow up; "doing much better, no other concerns"    59 y.o. male presents with the above complaint. History confirmed with patient.   Objective:  Physical Exam: warm, good capillary refill, no trophic changes or ulcerative lesions, normal DP and PT pulses and normal sensory exam. Left Foot: normal exam, no swelling, tenderness, instability; ligaments intact, full range of motion of all ankle/foot joints   Assessment:   1. Morton neuroma, left   2. Capsulitis of left foot     Plan:  Patient was evaluated and treated and all questions answered.  Capsulitis and Morton Neuroma -Dispense refurbished CMOs. -Discussed should pain persist f/u. Consider possible injection therapy.  No follow-ups on file.   MDM

## 2019-12-14 DIAGNOSIS — M75101 Unspecified rotator cuff tear or rupture of right shoulder, not specified as traumatic: Secondary | ICD-10-CM | POA: Diagnosis not present

## 2019-12-14 DIAGNOSIS — M25611 Stiffness of right shoulder, not elsewhere classified: Secondary | ICD-10-CM | POA: Diagnosis not present

## 2019-12-21 DIAGNOSIS — M75101 Unspecified rotator cuff tear or rupture of right shoulder, not specified as traumatic: Secondary | ICD-10-CM | POA: Diagnosis not present

## 2019-12-21 DIAGNOSIS — M25611 Stiffness of right shoulder, not elsewhere classified: Secondary | ICD-10-CM | POA: Diagnosis not present

## 2019-12-29 DIAGNOSIS — M25611 Stiffness of right shoulder, not elsewhere classified: Secondary | ICD-10-CM | POA: Diagnosis not present

## 2019-12-29 DIAGNOSIS — M75101 Unspecified rotator cuff tear or rupture of right shoulder, not specified as traumatic: Secondary | ICD-10-CM | POA: Diagnosis not present

## 2020-01-19 DIAGNOSIS — M25611 Stiffness of right shoulder, not elsewhere classified: Secondary | ICD-10-CM | POA: Diagnosis not present

## 2020-01-19 DIAGNOSIS — M75101 Unspecified rotator cuff tear or rupture of right shoulder, not specified as traumatic: Secondary | ICD-10-CM | POA: Diagnosis not present

## 2020-01-26 DIAGNOSIS — M75101 Unspecified rotator cuff tear or rupture of right shoulder, not specified as traumatic: Secondary | ICD-10-CM | POA: Diagnosis not present

## 2020-01-26 DIAGNOSIS — Z09 Encounter for follow-up examination after completed treatment for conditions other than malignant neoplasm: Secondary | ICD-10-CM | POA: Diagnosis not present

## 2020-01-26 DIAGNOSIS — M25611 Stiffness of right shoulder, not elsewhere classified: Secondary | ICD-10-CM | POA: Diagnosis not present

## 2020-04-11 DIAGNOSIS — H524 Presbyopia: Secondary | ICD-10-CM | POA: Diagnosis not present

## 2020-04-11 DIAGNOSIS — H35033 Hypertensive retinopathy, bilateral: Secondary | ICD-10-CM | POA: Diagnosis not present

## 2020-04-11 DIAGNOSIS — H43813 Vitreous degeneration, bilateral: Secondary | ICD-10-CM | POA: Diagnosis not present

## 2020-04-11 DIAGNOSIS — H40013 Open angle with borderline findings, low risk, bilateral: Secondary | ICD-10-CM | POA: Diagnosis not present

## 2020-04-11 DIAGNOSIS — H2513 Age-related nuclear cataract, bilateral: Secondary | ICD-10-CM | POA: Diagnosis not present

## 2020-04-11 DIAGNOSIS — H35371 Puckering of macula, right eye: Secondary | ICD-10-CM | POA: Diagnosis not present

## 2020-08-31 DIAGNOSIS — Z23 Encounter for immunization: Secondary | ICD-10-CM | POA: Diagnosis not present

## 2020-09-13 DIAGNOSIS — I10 Essential (primary) hypertension: Secondary | ICD-10-CM | POA: Diagnosis not present

## 2020-09-13 DIAGNOSIS — Z0000 Encounter for general adult medical examination without abnormal findings: Secondary | ICD-10-CM | POA: Diagnosis not present

## 2020-09-13 DIAGNOSIS — Z13220 Encounter for screening for lipoid disorders: Secondary | ICD-10-CM | POA: Diagnosis not present

## 2020-09-13 DIAGNOSIS — Z125 Encounter for screening for malignant neoplasm of prostate: Secondary | ICD-10-CM | POA: Diagnosis not present

## 2020-09-28 DIAGNOSIS — Z23 Encounter for immunization: Secondary | ICD-10-CM | POA: Diagnosis not present

## 2021-04-02 IMAGING — CR DG SHOULDER 2+V*R*
3 series · 3 of 3 positions shown · non-contrast
Comparison: None.

CLINICAL DATA: Right shoulder pain.

EXAM:
RIGHT SHOULDER - 2+ VIEW

[w shoulder ap internal righ *]
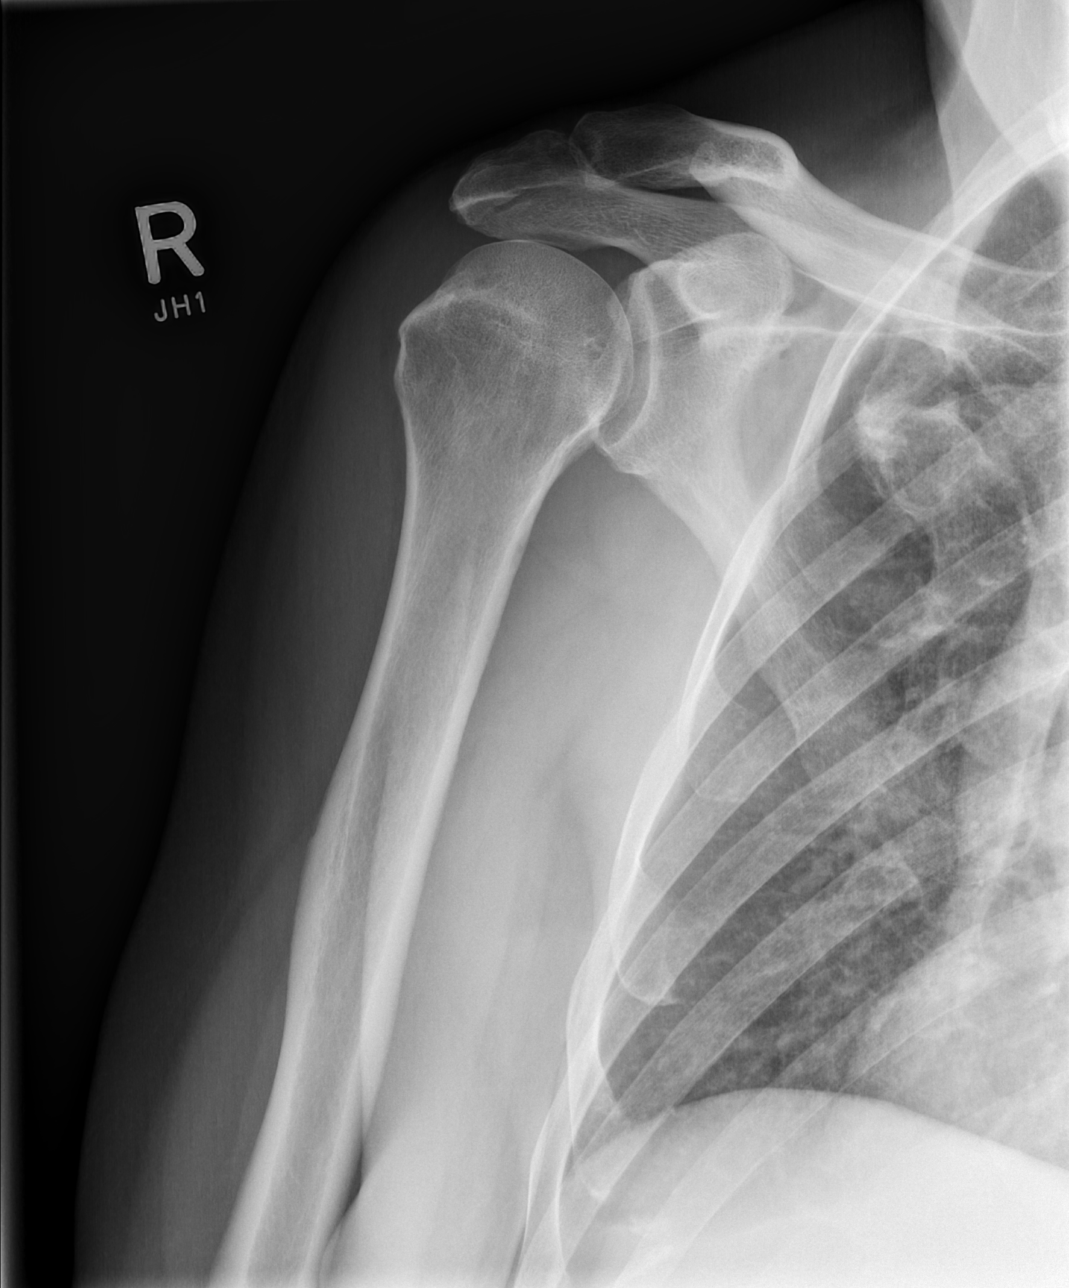

[w shoulder ap external righ *]
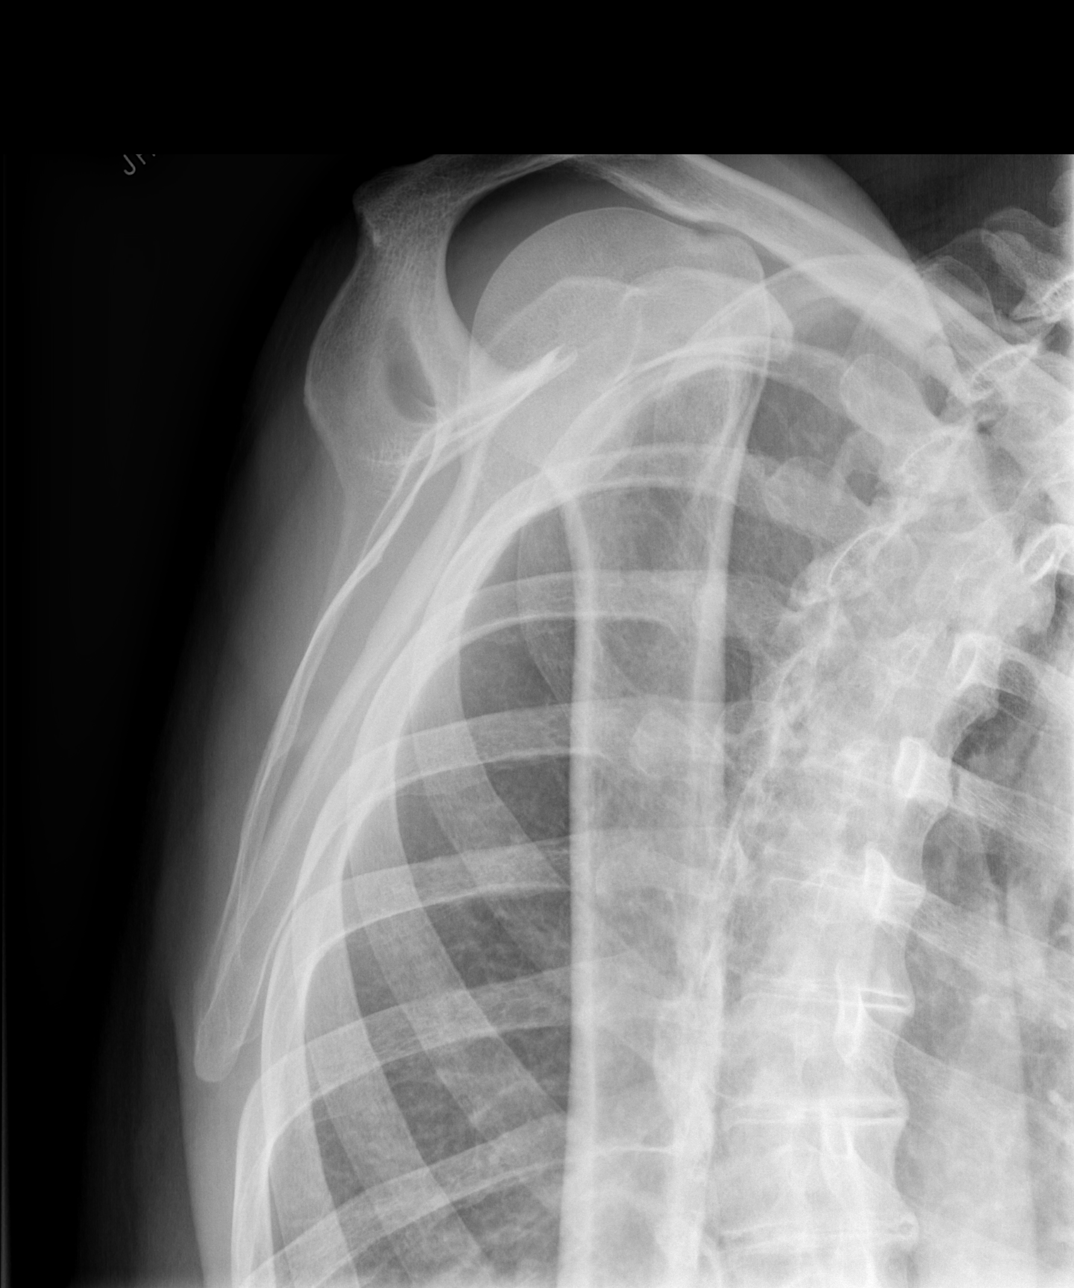

[x shoulder axillary right]
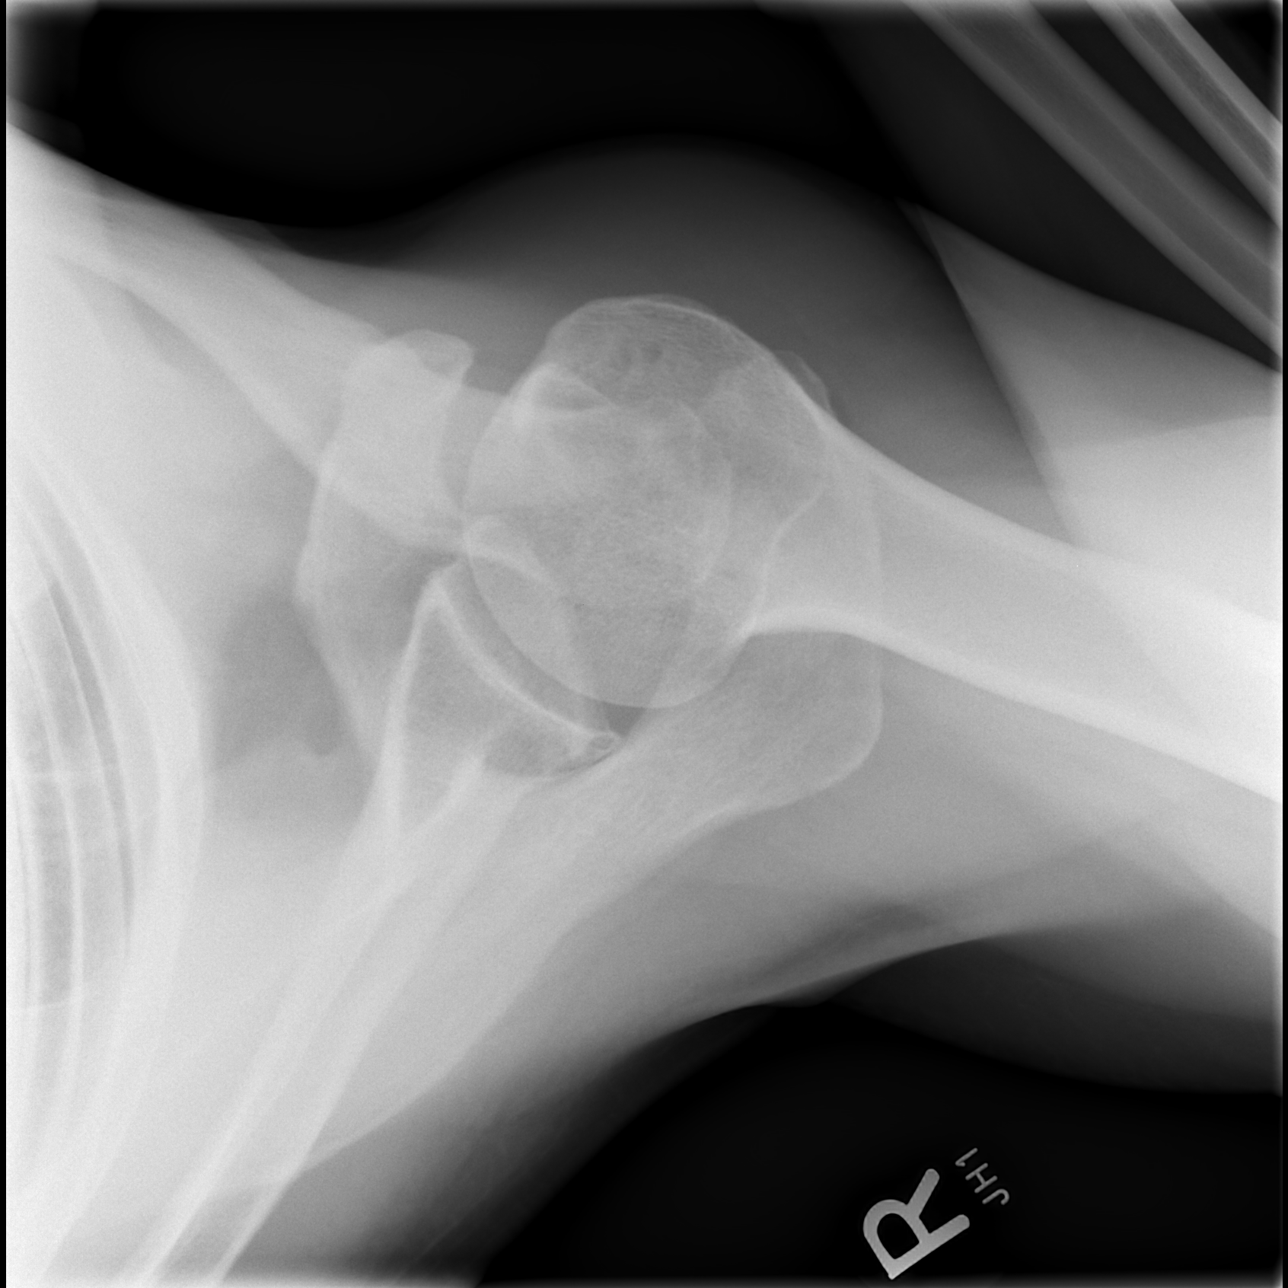

[3 of 3 positions shown; findings below may reference images not displayed]

FINDINGS: There is no evidence of fracture or dislocation. There is no
evidence of arthropathy or other focal bone abnormality. Soft
tissues are unremarkable.
IMPRESSION: Negative.

## 2021-04-17 IMAGING — MR MR SHOULDER*R* W/O CM
4 of 5 series · 19 of 40 positions shown · non-contrast
Comparison: None.

CLINICAL DATA: Status post fall 1 month ago.  Unable to raise arm.

EXAM:
MRI OF THE RIGHT SHOULDER WITHOUT CONTRAST
TECHNIQUE: Multiplanar, multisequence MR imaging of the shoulder was performed.
No intravenous contrast was administered.

[Series 3: PD · axial · 4.0mm · 0.29mm/px · z∈[-42,+44]mm · 6 of 25 slices shown]
[im 1/25]
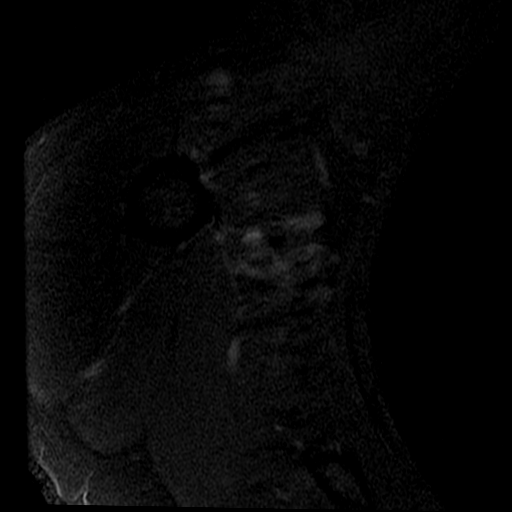
[im 3/25]
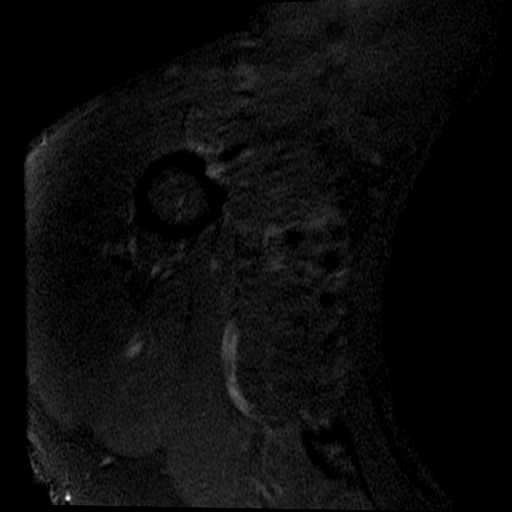
[im 9/25]
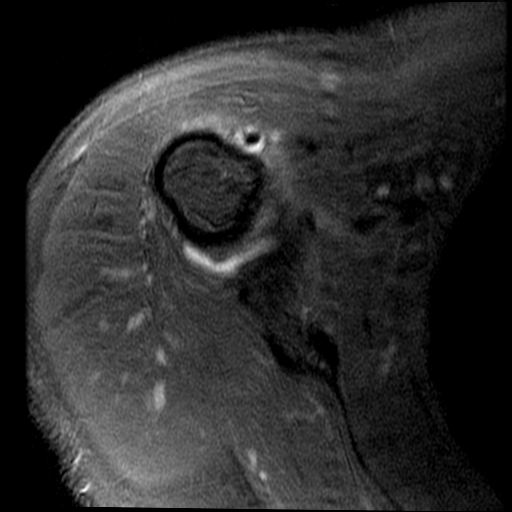
[im 11/25]
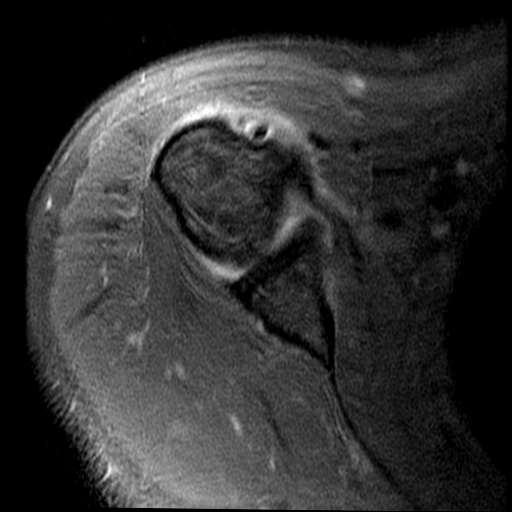
[im 14/25]
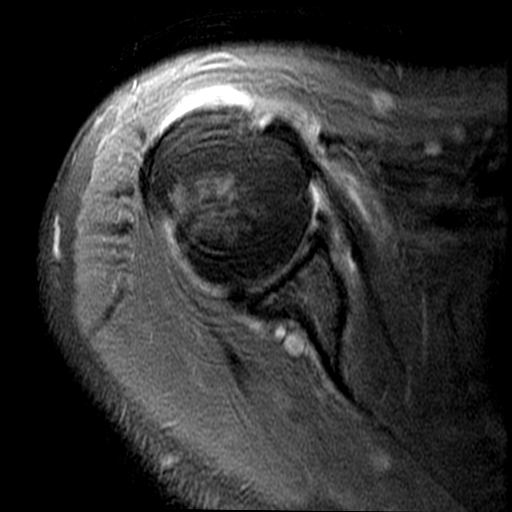
[im 22/25]
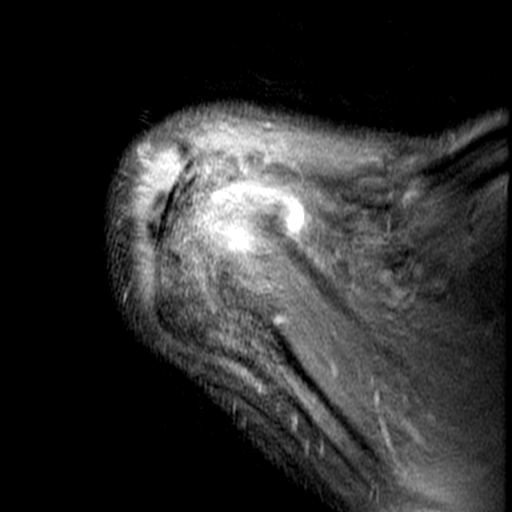

[Series 7: T2 fat-sat · sagittal · 4.0mm · 0.27mm/px · 3 of 18 slices shown (1 of 2)]
[im 3/18]
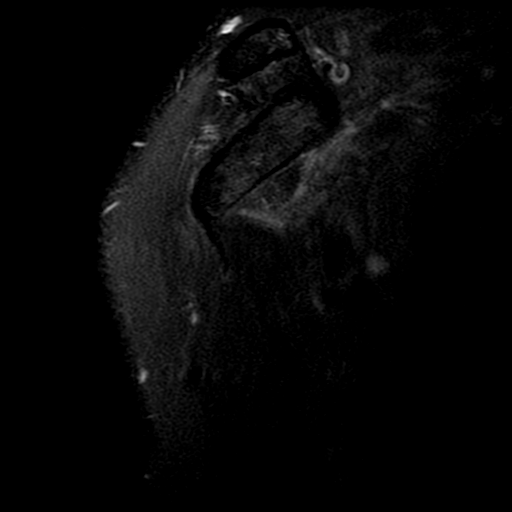
[im 9/18]
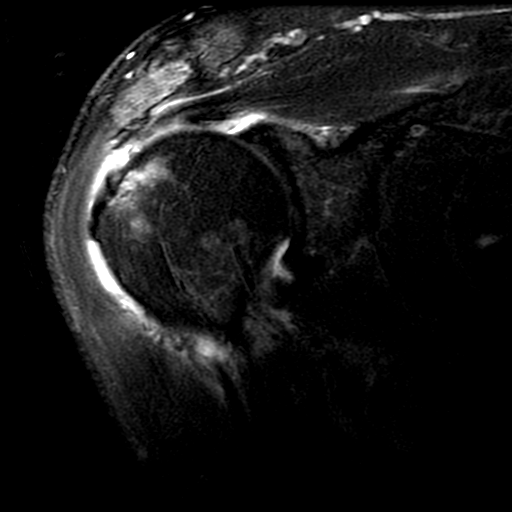
[im 15/18]
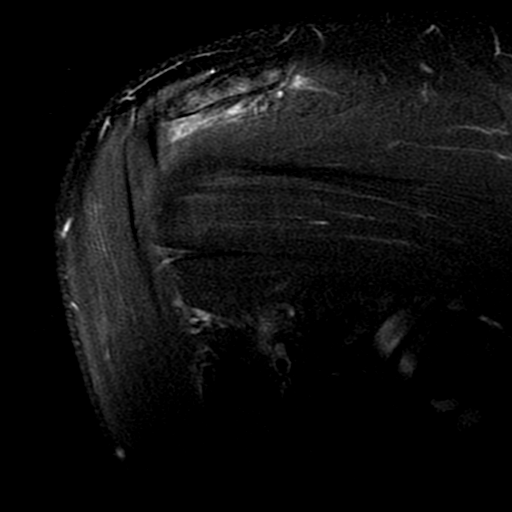

[Series 8: PD fat-sat · sagittal · 4.0mm · 0.27mm/px · 7 of 18 slices shown]
[im 1/18]
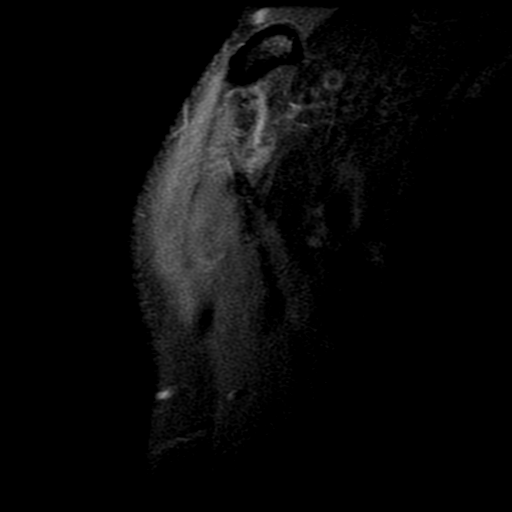
[im 3/18]
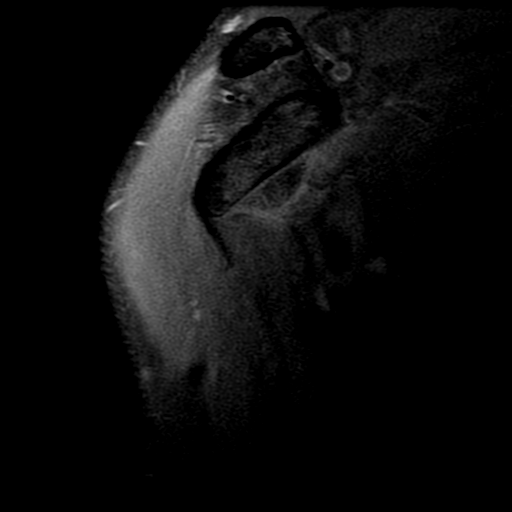
[im 6/18]
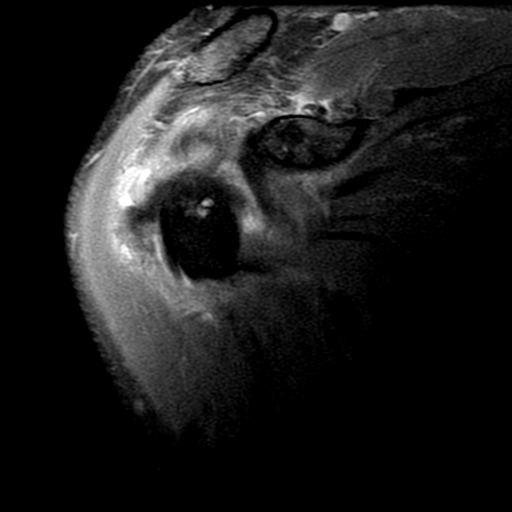
[im 9/18]
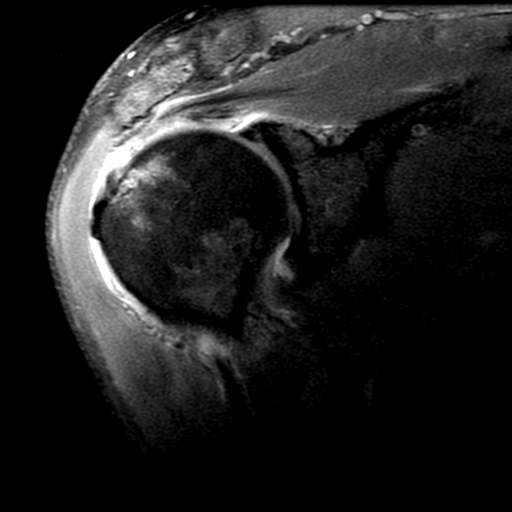
[im 12/18]
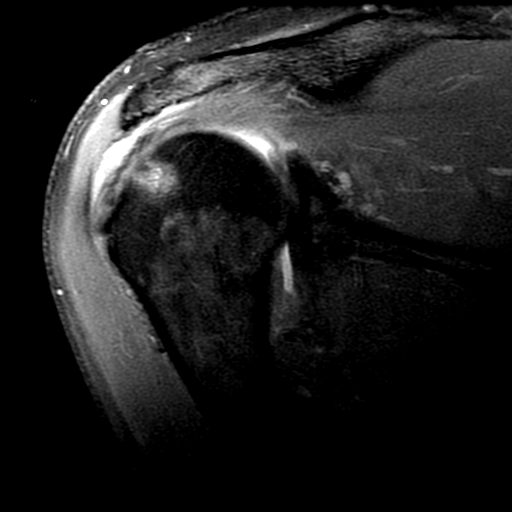
[im 15/18]
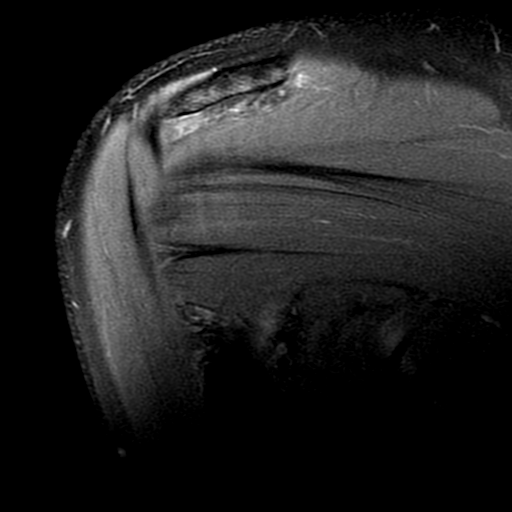
[im 18/18]
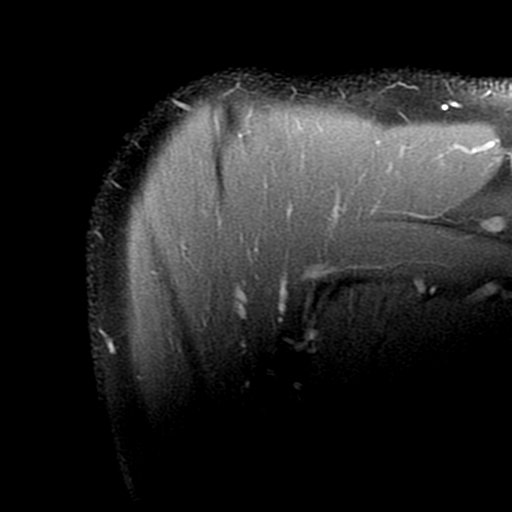

[Series 9: T2 fat-sat · coronal · 4.0mm · 0.29mm/px · 3 of 19 slices shown (2 of 2)]
[im 3/19]
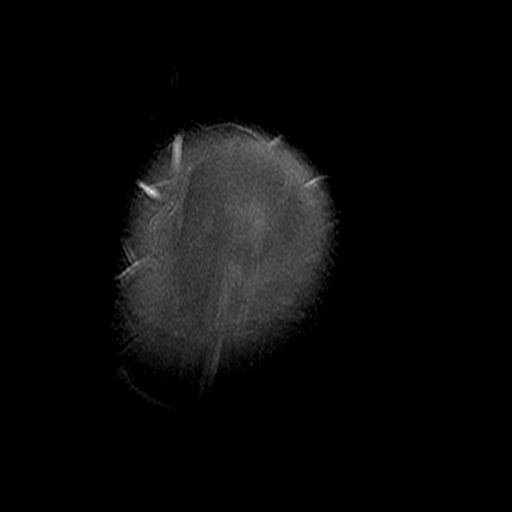
[im 11/19]
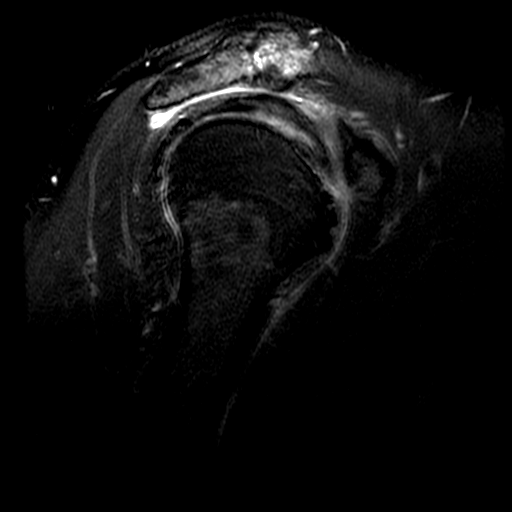
[im 16/19]
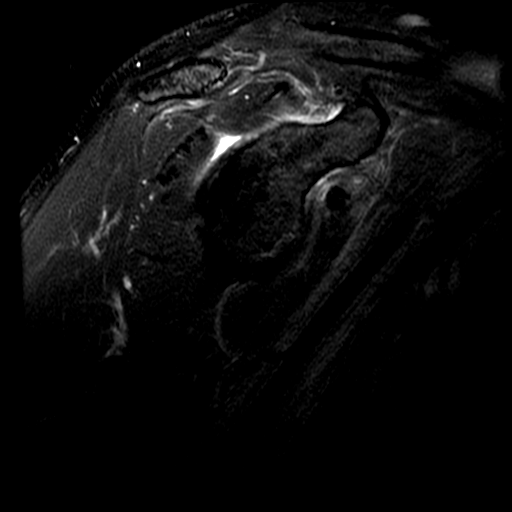

[19 of 40 positions shown; findings below may reference images not displayed]

FINDINGS: Rotator cuff: Severe tendinosis of the supraspinatus tendon with a
high-grade partial-thickness bursal surface tear with a 5 mm
full-thickness component anteriorly. Mild tendinosis of the
infraspinatus tendon. Teres minor tendon is intact. Subscapularis
tendon is intact.

Muscles: No atrophy or fatty replacement of nor abnormal signal
within, the muscles of the rotator cuff.

Biceps long head:  Intact.

Acromioclavicular Joint: Moderate arthropathy of the
acromioclavicular joint. Type I acromion. Trace
subacromial/subdeltoid bursal fluid.

Glenohumeral Joint: No joint effusion.  No chondral defect.

Labrum: Grossly intact, but evaluation is limited by lack of
intraarticular fluid.

Bones: No acute osseous abnormality. No aggressive osseous lesion.
Subcortical marrow edema at the infraspinatus insertion.

Other: No fluid collection or hematoma.
IMPRESSION: 1. Severe tendinosis of the supraspinatus tendon with a high-grade
partial-thickness bursal surface tear with a 5 mm full-thickness
component anteriorly.
2. Mild tendinosis of the infraspinatus tendon.
3. Mild subacromial/subdeltoid bursitis.

## 2021-09-20 DIAGNOSIS — Z0000 Encounter for general adult medical examination without abnormal findings: Secondary | ICD-10-CM | POA: Diagnosis not present

## 2021-09-20 DIAGNOSIS — Z125 Encounter for screening for malignant neoplasm of prostate: Secondary | ICD-10-CM | POA: Diagnosis not present

## 2021-09-20 DIAGNOSIS — I10 Essential (primary) hypertension: Secondary | ICD-10-CM | POA: Diagnosis not present

## 2021-09-20 DIAGNOSIS — Z23 Encounter for immunization: Secondary | ICD-10-CM | POA: Diagnosis not present

## 2021-10-15 DIAGNOSIS — I10 Essential (primary) hypertension: Secondary | ICD-10-CM | POA: Diagnosis not present

## 2022-04-23 DIAGNOSIS — K5730 Diverticulosis of large intestine without perforation or abscess without bleeding: Secondary | ICD-10-CM | POA: Diagnosis not present

## 2022-04-23 DIAGNOSIS — D124 Benign neoplasm of descending colon: Secondary | ICD-10-CM | POA: Diagnosis not present

## 2022-04-23 DIAGNOSIS — K648 Other hemorrhoids: Secondary | ICD-10-CM | POA: Diagnosis not present

## 2022-04-23 DIAGNOSIS — Z1211 Encounter for screening for malignant neoplasm of colon: Secondary | ICD-10-CM | POA: Diagnosis not present

## 2022-04-23 DIAGNOSIS — K644 Residual hemorrhoidal skin tags: Secondary | ICD-10-CM | POA: Diagnosis not present

## 2022-04-23 DIAGNOSIS — Z8371 Family history of colonic polyps: Secondary | ICD-10-CM | POA: Diagnosis not present

## 2022-08-28 DIAGNOSIS — H43813 Vitreous degeneration, bilateral: Secondary | ICD-10-CM | POA: Diagnosis not present

## 2022-08-28 DIAGNOSIS — H2513 Age-related nuclear cataract, bilateral: Secondary | ICD-10-CM | POA: Diagnosis not present

## 2022-08-28 DIAGNOSIS — H35371 Puckering of macula, right eye: Secondary | ICD-10-CM | POA: Diagnosis not present

## 2022-08-28 DIAGNOSIS — H02889 Meibomian gland dysfunction of unspecified eye, unspecified eyelid: Secondary | ICD-10-CM | POA: Diagnosis not present

## 2022-10-29 DIAGNOSIS — Z131 Encounter for screening for diabetes mellitus: Secondary | ICD-10-CM | POA: Diagnosis not present

## 2022-10-29 DIAGNOSIS — I10 Essential (primary) hypertension: Secondary | ICD-10-CM | POA: Diagnosis not present

## 2022-10-29 DIAGNOSIS — Z0000 Encounter for general adult medical examination without abnormal findings: Secondary | ICD-10-CM | POA: Diagnosis not present

## 2022-10-29 DIAGNOSIS — N401 Enlarged prostate with lower urinary tract symptoms: Secondary | ICD-10-CM | POA: Diagnosis not present

## 2022-10-29 DIAGNOSIS — Z13220 Encounter for screening for lipoid disorders: Secondary | ICD-10-CM | POA: Diagnosis not present

## 2023-03-05 DIAGNOSIS — H35371 Puckering of macula, right eye: Secondary | ICD-10-CM | POA: Diagnosis not present

## 2023-03-05 DIAGNOSIS — H2513 Age-related nuclear cataract, bilateral: Secondary | ICD-10-CM | POA: Diagnosis not present

## 2023-03-05 DIAGNOSIS — H43813 Vitreous degeneration, bilateral: Secondary | ICD-10-CM | POA: Diagnosis not present

## 2023-03-05 DIAGNOSIS — H02889 Meibomian gland dysfunction of unspecified eye, unspecified eyelid: Secondary | ICD-10-CM | POA: Diagnosis not present

## 2023-03-20 DIAGNOSIS — L738 Other specified follicular disorders: Secondary | ICD-10-CM | POA: Diagnosis not present

## 2023-03-20 DIAGNOSIS — L821 Other seborrheic keratosis: Secondary | ICD-10-CM | POA: Diagnosis not present

## 2023-03-20 DIAGNOSIS — L570 Actinic keratosis: Secondary | ICD-10-CM | POA: Diagnosis not present

## 2023-03-20 DIAGNOSIS — D2272 Melanocytic nevi of left lower limb, including hip: Secondary | ICD-10-CM | POA: Diagnosis not present

## 2023-03-20 DIAGNOSIS — D225 Melanocytic nevi of trunk: Secondary | ICD-10-CM | POA: Diagnosis not present

## 2023-11-06 DIAGNOSIS — N401 Enlarged prostate with lower urinary tract symptoms: Secondary | ICD-10-CM | POA: Diagnosis not present

## 2023-11-06 DIAGNOSIS — Z125 Encounter for screening for malignant neoplasm of prostate: Secondary | ICD-10-CM | POA: Diagnosis not present

## 2023-11-06 DIAGNOSIS — Z131 Encounter for screening for diabetes mellitus: Secondary | ICD-10-CM | POA: Diagnosis not present

## 2023-11-06 DIAGNOSIS — I10 Essential (primary) hypertension: Secondary | ICD-10-CM | POA: Diagnosis not present

## 2023-11-06 DIAGNOSIS — Z13228 Encounter for screening for other metabolic disorders: Secondary | ICD-10-CM | POA: Diagnosis not present

## 2023-11-06 DIAGNOSIS — Z0000 Encounter for general adult medical examination without abnormal findings: Secondary | ICD-10-CM | POA: Diagnosis not present

## 2023-11-06 DIAGNOSIS — Z13220 Encounter for screening for lipoid disorders: Secondary | ICD-10-CM | POA: Diagnosis not present

## 2023-12-10 DIAGNOSIS — H2513 Age-related nuclear cataract, bilateral: Secondary | ICD-10-CM | POA: Diagnosis not present

## 2023-12-10 DIAGNOSIS — H35371 Puckering of macula, right eye: Secondary | ICD-10-CM | POA: Diagnosis not present

## 2023-12-10 DIAGNOSIS — H02889 Meibomian gland dysfunction of unspecified eye, unspecified eyelid: Secondary | ICD-10-CM | POA: Diagnosis not present

## 2023-12-10 DIAGNOSIS — H43813 Vitreous degeneration, bilateral: Secondary | ICD-10-CM | POA: Diagnosis not present

## 2024-11-19 DIAGNOSIS — Z0000 Encounter for general adult medical examination without abnormal findings: Secondary | ICD-10-CM | POA: Diagnosis not present

## 2024-11-19 DIAGNOSIS — Z79899 Other long term (current) drug therapy: Secondary | ICD-10-CM | POA: Diagnosis not present

## 2024-11-19 DIAGNOSIS — Z136 Encounter for screening for cardiovascular disorders: Secondary | ICD-10-CM | POA: Diagnosis not present

## 2024-11-19 DIAGNOSIS — I10 Essential (primary) hypertension: Secondary | ICD-10-CM | POA: Diagnosis not present

## 2024-11-19 DIAGNOSIS — Z125 Encounter for screening for malignant neoplasm of prostate: Secondary | ICD-10-CM | POA: Diagnosis not present

## 2024-11-19 DIAGNOSIS — Z1159 Encounter for screening for other viral diseases: Secondary | ICD-10-CM | POA: Diagnosis not present
